# Patient Record
Sex: Male | Born: 1935 | Hispanic: No | Marital: Married
Health system: Southern US, Community
[De-identification: ages and names within clinical notes are randomized; demographics above are authoritative.]

---

## 2006-01-27 ENCOUNTER — Emergency Department: Payer: Self-pay | Admitting: Emergency Medicine

## 2006-02-01 ENCOUNTER — Emergency Department: Payer: Self-pay

## 2006-02-13 ENCOUNTER — Emergency Department: Payer: Self-pay | Admitting: Emergency Medicine

## 2006-03-05 ENCOUNTER — Emergency Department: Payer: Self-pay | Admitting: Urology

## 2006-03-28 ENCOUNTER — Ambulatory Visit: Payer: Self-pay | Admitting: Specialist

## 2006-03-28 ENCOUNTER — Other Ambulatory Visit: Payer: Self-pay

## 2006-04-04 ENCOUNTER — Ambulatory Visit: Payer: Self-pay | Admitting: Specialist

## 2007-09-24 ENCOUNTER — Other Ambulatory Visit: Payer: Self-pay

## 2007-09-24 ENCOUNTER — Ambulatory Visit: Payer: Self-pay | Admitting: Urology

## 2007-10-08 ENCOUNTER — Ambulatory Visit: Payer: Self-pay | Admitting: Urology

## 2008-05-18 ENCOUNTER — Ambulatory Visit: Payer: Self-pay | Admitting: Internal Medicine

## 2008-05-18 ENCOUNTER — Ambulatory Visit: Payer: Self-pay | Admitting: Ophthalmology

## 2008-05-31 ENCOUNTER — Ambulatory Visit: Payer: Self-pay | Admitting: Ophthalmology

## 2009-06-17 ENCOUNTER — Ambulatory Visit: Payer: Self-pay | Admitting: Rheumatology

## 2012-12-14 ENCOUNTER — Inpatient Hospital Stay: Payer: Self-pay | Admitting: Internal Medicine

## 2012-12-14 LAB — COMPREHENSIVE METABOLIC PANEL
Alkaline Phosphatase: 76 U/L (ref 50–136)
Anion Gap: 9 (ref 7–16)
Chloride: 105 mmol/L (ref 98–107)
Co2: 21 mmol/L (ref 21–32)
Creatinine: 1.15 mg/dL (ref 0.60–1.30)
EGFR (Non-African Amer.): 55 — ABNORMAL LOW
Glucose: 123 mg/dL — ABNORMAL HIGH (ref 65–99)
Osmolality: 274 (ref 275–301)
SGPT (ALT): 19 U/L (ref 12–78)
Sodium: 135 mmol/L — ABNORMAL LOW (ref 136–145)

## 2012-12-14 LAB — TROPONIN I
Troponin-I: 0.12 ng/mL — ABNORMAL HIGH
Troponin-I: 0.13 ng/mL — ABNORMAL HIGH
Troponin-I: 0.15 ng/mL — ABNORMAL HIGH
Troponin-I: 0.16 ng/mL — ABNORMAL HIGH

## 2012-12-14 LAB — CBC
HCT: 36 % — ABNORMAL LOW (ref 40.0–52.0)
MCH: 30.7 pg (ref 26.0–34.0)
MCHC: 32.9 g/dL (ref 32.0–36.0)
Platelet: 157 10*3/uL (ref 150–440)
RBC: 3.86 10*6/uL — ABNORMAL LOW (ref 4.40–5.90)
RDW: 14.5 % (ref 11.5–14.5)
WBC: 5.2 10*3/uL (ref 3.8–10.6)

## 2012-12-14 LAB — PRO B NATRIURETIC PEPTIDE: B-Type Natriuretic Peptide: 10239 pg/mL — ABNORMAL HIGH (ref 0–450)

## 2012-12-14 LAB — CK TOTAL AND CKMB (NOT AT ARMC)
CK, Total: 175 U/L (ref 35–232)
CK-MB: 2.8 ng/mL (ref 0.5–3.6)
CK-MB: 2.9 ng/mL (ref 0.5–3.6)
CK-MB: 2.9 ng/mL (ref 0.5–3.6)

## 2012-12-14 LAB — URINALYSIS, COMPLETE
Glucose,UR: NEGATIVE mg/dL (ref 0–75)
Hyaline Cast: 8
Ketone: NEGATIVE
Ph: 5 (ref 4.5–8.0)
RBC,UR: 2 /HPF (ref 0–5)
Renal Epithelial: <1
Squamous Epithelial: 1
WBC UR: 7 /HPF (ref 0–5)

## 2012-12-14 LAB — TSH: Thyroid Stimulating Horm: 1.44 u[IU]/mL

## 2012-12-15 LAB — CBC WITH DIFFERENTIAL/PLATELET
Basophil %: 1 %
Eosinophil %: 3.4 %
HCT: 33.2 % — ABNORMAL LOW (ref 40.0–52.0)
HGB: 11.2 g/dL — ABNORMAL LOW (ref 13.0–18.0)
Lymphocyte #: 1.4 10*3/uL (ref 1.0–3.6)
Lymphocyte %: 21.6 %
MCV: 93 fL (ref 80–100)
Monocyte %: 9.7 %
Neutrophil %: 64.3 %
Platelet: 185 10*3/uL (ref 150–440)
RBC: 3.56 10*6/uL — ABNORMAL LOW (ref 4.40–5.90)
RDW: 14.6 % — ABNORMAL HIGH (ref 11.5–14.5)
WBC: 6.5 10*3/uL (ref 3.8–10.6)

## 2012-12-15 LAB — BASIC METABOLIC PANEL
Anion Gap: 11 (ref 7–16)
BUN: 24 mg/dL — ABNORMAL HIGH (ref 7–18)
Calcium, Total: 8.8 mg/dL (ref 8.5–10.1)
Chloride: 102 mmol/L (ref 98–107)
Co2: 22 mmol/L (ref 21–32)
Creatinine: 1.61 mg/dL — ABNORMAL HIGH (ref 0.60–1.30)
EGFR (African American): 42 — ABNORMAL LOW
Glucose: 186 mg/dL — ABNORMAL HIGH (ref 65–99)
Osmolality: 279 (ref 275–301)
Potassium: 4.2 mmol/L (ref 3.5–5.1)

## 2012-12-15 LAB — COMPREHENSIVE METABOLIC PANEL
Albumin: 3.3 g/dL — ABNORMAL LOW (ref 3.4–5.0)
Bilirubin,Total: 0.4 mg/dL (ref 0.2–1.0)

## 2012-12-15 LAB — LIPID PANEL
HDL Cholesterol: 76 mg/dL — ABNORMAL HIGH (ref 40–60)
Ldl Cholesterol, Calc: 43 mg/dL (ref 0–100)
Triglycerides: 78 mg/dL (ref 0–200)
VLDL Cholesterol, Calc: 16 mg/dL (ref 5–40)

## 2012-12-16 LAB — COMPREHENSIVE METABOLIC PANEL
Calcium, Total: 8.6 mg/dL (ref 8.5–10.1)
Co2: 22 mmol/L (ref 21–32)
EGFR (African American): 27 — ABNORMAL LOW
EGFR (Non-African Amer.): 23 — ABNORMAL LOW
Osmolality: 269 (ref 275–301)
SGOT(AST): 65 U/L — ABNORMAL HIGH (ref 15–37)
Sodium: 127 mmol/L — ABNORMAL LOW (ref 136–145)
Total Protein: 6.7 g/dL (ref 6.4–8.2)

## 2012-12-16 LAB — BASIC METABOLIC PANEL
Calcium, Total: 8.5 mg/dL (ref 8.5–10.1)
Chloride: 100 mmol/L (ref 98–107)
Co2: 23 mmol/L (ref 21–32)
Creatinine: 2.22 mg/dL — ABNORMAL HIGH (ref 0.60–1.30)
Osmolality: 271 (ref 275–301)

## 2012-12-16 LAB — CBC WITH DIFFERENTIAL/PLATELET
Eosinophil #: 0 10*3/uL (ref 0.0–0.7)
HGB: 11.3 g/dL — ABNORMAL LOW (ref 13.0–18.0)
Lymphocyte #: 0.9 10*3/uL — ABNORMAL LOW (ref 1.0–3.6)
MCHC: 33.8 g/dL (ref 32.0–36.0)
MCV: 93 fL (ref 80–100)
Monocyte %: 8.1 %
Neutrophil %: 78 %
Platelet: 184 10*3/uL (ref 150–440)

## 2012-12-17 LAB — COMPREHENSIVE METABOLIC PANEL
Albumin: 3.2 g/dL — ABNORMAL LOW (ref 3.4–5.0)
Alkaline Phosphatase: 101 U/L (ref 50–136)
Anion Gap: 10 (ref 7–16)
Bilirubin,Total: 0.4 mg/dL (ref 0.2–1.0)
Calcium, Total: 8.2 mg/dL — ABNORMAL LOW (ref 8.5–10.1)
Co2: 20 mmol/L — ABNORMAL LOW (ref 21–32)
Creatinine: 2.69 mg/dL — ABNORMAL HIGH (ref 0.60–1.30)
EGFR (African American): 23 — ABNORMAL LOW
EGFR (Non-African Amer.): 20 — ABNORMAL LOW
Glucose: 131 mg/dL — ABNORMAL HIGH (ref 65–99)
Potassium: 4.9 mmol/L (ref 3.5–5.1)
SGOT(AST): 54 U/L — ABNORMAL HIGH (ref 15–37)
SGPT (ALT): 66 U/L (ref 12–78)
Sodium: 126 mmol/L — ABNORMAL LOW (ref 136–145)

## 2012-12-17 LAB — CBC WITH DIFFERENTIAL/PLATELET
Basophil #: 0 10*3/uL (ref 0.0–0.1)
Basophil %: 0.1 %
Lymphocyte #: 1.1 10*3/uL (ref 1.0–3.6)
Lymphocyte %: 14.5 %
MCH: 31.4 pg (ref 26.0–34.0)
MCV: 92 fL (ref 80–100)
Monocyte #: 0.6 x10 3/mm (ref 0.2–1.0)
Neutrophil %: 77.5 %
Platelet: 181 10*3/uL (ref 150–440)
RBC: 3.56 10*6/uL — ABNORMAL LOW (ref 4.40–5.90)
WBC: 7.5 10*3/uL (ref 3.8–10.6)

## 2012-12-17 LAB — TROPONIN I: Troponin-I: 0.17 ng/mL — ABNORMAL HIGH

## 2012-12-17 LAB — URINALYSIS, COMPLETE
Blood: NEGATIVE
Hyaline Cast: 79
Ketone: NEGATIVE
Specific Gravity: 1.012 (ref 1.003–1.030)
Squamous Epithelial: 1
Transitional Epi: <1

## 2012-12-18 LAB — CBC WITH DIFFERENTIAL/PLATELET
Basophil #: 0 10*3/uL (ref 0.0–0.1)
Basophil %: 0.4 %
Eosinophil #: 0 10*3/uL (ref 0.0–0.7)
HGB: 11.9 g/dL — ABNORMAL LOW (ref 13.0–18.0)
MCH: 31 pg (ref 26.0–34.0)
MCHC: 33.9 g/dL (ref 32.0–36.0)
MCV: 91 fL (ref 80–100)
Monocyte #: 1.1 x10 3/mm — ABNORMAL HIGH (ref 0.2–1.0)
Monocyte %: 9 %
Neutrophil %: 79 %
RBC: 3.83 10*6/uL — ABNORMAL LOW (ref 4.40–5.90)
WBC: 12 10*3/uL — ABNORMAL HIGH (ref 3.8–10.6)

## 2012-12-18 LAB — BASIC METABOLIC PANEL
Anion Gap: 12 (ref 7–16)
BUN: 56 mg/dL — ABNORMAL HIGH (ref 7–18)
Chloride: 95 mmol/L — ABNORMAL LOW (ref 98–107)
EGFR (African American): 22 — ABNORMAL LOW
EGFR (Non-African Amer.): 19 — ABNORMAL LOW
Potassium: 5.1 mmol/L (ref 3.5–5.1)
Sodium: 125 mmol/L — ABNORMAL LOW (ref 136–145)

## 2012-12-19 LAB — COMPREHENSIVE METABOLIC PANEL
Albumin: 3.1 g/dL — ABNORMAL LOW (ref 3.4–5.0)
BUN: 61 mg/dL — ABNORMAL HIGH (ref 7–18)
Calcium, Total: 8.4 mg/dL — ABNORMAL LOW (ref 8.5–10.1)
Co2: 22 mmol/L (ref 21–32)
EGFR (African American): 23 — ABNORMAL LOW
Glucose: 96 mg/dL (ref 65–99)
Osmolality: 269 (ref 275–301)
Potassium: 5 mmol/L (ref 3.5–5.1)
Sodium: 125 mmol/L — ABNORMAL LOW (ref 136–145)
Total Protein: 6.3 g/dL — ABNORMAL LOW (ref 6.4–8.2)

## 2012-12-19 LAB — CBC WITH DIFFERENTIAL/PLATELET
Basophil #: 0 10*3/uL (ref 0.0–0.1)
HCT: 32.5 % — ABNORMAL LOW (ref 40.0–52.0)
Lymphocyte %: 7.4 %
MCH: 31 pg (ref 26.0–34.0)
MCHC: 33.4 g/dL (ref 32.0–36.0)
MCV: 93 fL (ref 80–100)
Neutrophil #: 10 10*3/uL — ABNORMAL HIGH (ref 1.4–6.5)
Neutrophil %: 82.2 %
RBC: 3.51 10*6/uL — ABNORMAL LOW (ref 4.40–5.90)
RDW: 14.6 % — ABNORMAL HIGH (ref 11.5–14.5)
WBC: 12.2 10*3/uL — ABNORMAL HIGH (ref 3.8–10.6)

## 2013-01-24 DEATH — deceased

## 2014-07-16 NOTE — Consult Note (Signed)
PATIENT NAME:  Daniel Galloway, Daniel Galloway MR#:  [id number] DATE OF BIRTH:  01/12/1919  DATE OF CONSULTATION:  12/14/2012  REFERRING PHYSICIAN:  Dr. Hyacinth MeekerMiller.  CONSULTING PHYSICIAN:  Marcina MillardAlexander Maida Widger, MD  CHIEF COMPLAINT: Shortness of breath.   HISTORY OF PRESENT ILLNESS: The patient is a 81 year old gentleman referred for evaluation of atrial fibrillation and atrial flutter. The patient was in his usual state of health until he presented to [facility] Emergency Room with 7 to 10 day history of increasing shortness of breath and peripheral edema. The patient was recently started on outpatient antibiotics but the patient has continued to experience nonproductive cough. In the Emergency Room, the patient was noted to be in atrial fibrillation and atrial flutter with a rate of 110 to 120. Chest x-ray revealed evidence for pulmonary edema. The patient was treated with intravenous furosemide. Initial troponin was borderline elevated at 0.12. The patient denies chest pain.   PAST MEDICAL HISTORY: 1. Hypertension.  2. Benign prostatic hypertrophy.   HOME MEDICATIONS:  1. Doxazosin 4 mg daily.  2. Chlorthalidone 25 mg daily.  3. Loratadine 10 mg daily.  4. Fish oil 1000 mg daily.  5. Colchicine 0.6 mg daily.   SOCIAL HISTORY: The patient is a widower. He currently lives with his daughter. He denies tobacco abuse. The patient is not very active. He is a limited by pain in his right hip.   FAMILY HISTORY: No immediate family history of coronary artery disease or myocardial infarction.   REVIEW OF SYSTEMS:  CONSTITUTIONAL: The patient denies fever or chills.   EYES: No blurry vision.  EARS: No hearing loss.  RESPIRATORY: The patient has shortness of breath, nonproductive cough.  CARDIOVASCULAR: The patient currently denies chest pain.  GASTROINTESTINAL: No nausea, vomiting, diarrhea, or constipation.  GENITOURINARY: No dysuria or hematuria.  ENDOCRINE: No polyuria or polydipsia.  HEMATOLOGICAL: No easy bruising  or bleeding.  INTEGUMENTARY: No rash.  MUSCULOSKELETAL: The patient has generalized arthritis and right hip pain.  NEUROLOGICAL: No focal muscle weakness or numbness.  PSYCHOLOGICAL: No depression or anxiety.   PHYSICAL EXAMINATION: VITAL SIGNS: Blood pressure 152/70, pulse 103, respirations 18, temperature 97.3, pulse oximetry 96%.  HEENT: Pupils equal, reactive to light and accommodation.  NECK: Supple without thyromegaly. LUNGS: Reveal decreased breath sounds in both bases.  HEART: Normal JVP. Normal PMI. Regular rate and rhythm. Normal S1, S2. No appreciable gallop, murmur, or rub.  ABDOMEN: Soft and nontender. Pulses were intact bilaterally. MUSCULOSKELETAL: norma lmuscle tone.  NEUROLOGIC: The patient is alert and oriented x3. Motor and sensory both grossly intact.   IMPRESSION: This is a 81 year old gentleman with probable baseline chronic obstructive pulmonary disease who presents with shortness of breath, pulmonary edema, probable bronchitis with atrial fibrillation/flutter now converted to sinus rhythm with borderline elevated troponin likely due to demand supply ischemia and not due to acute coronary syndrome.   RECOMMENDATIONS: 1. Agree with overall current therapy.  2. Would defer full dose anticoagulation.  3. In light of the patient's advanced age and risk for falling, would continue aspirin for stroke prevention and defer chronic anticoagulation.  4. Agree with metoprolol, change dosing to 50 mg b.i.d.  5. Review 2-D echocardiogram.  6. Would defer functional study or invasive cardiac evaluation in light of the patient's advanced age.  ____________________________ Marcina MillardAlexander Jacklyne Baik, MD ap:sg D: 12/14/2012 09:47:53 ET T: 12/14/2012 10:47:05 ET JOB#: [id number]  cc: Marcina MillardAlexander Jahlani Lorentz, MD, <Dictator> Marcina MillardALEXANDER Marzella Miracle MD ELECTRONICALLY SIGNED 01/05/2013 11:25

## 2014-07-16 NOTE — H&P (Signed)
PATIENT NAME:  Daniel Galloway, Daniel Galloway MR#:  [id number] DATE OF BIRTH:  1918-08-17  DATE OF ADMISSION:  12/14/2012  PRIMARY CARE PHYSICIAN: Dr. Bethann Punches.   REFERRING PHYSICIAN: Dr. Dolores Frame.   CHIEF COMPLAINT: Shortness of breath and ankle swelling.   HISTORY OF PRESENT ILLNESS: The patient is a 81 year old African American male with a past medical history of hypertension and benign prostatic hypertrophy. He is presenting to the ER with chief complaint of severe shortness of breath. The patient's daughter at bedside is reporting that the patient has been short of breath since 09/12. His ankles are swollen and as his shortness of breath is progressively getting worse he was seen by his primary care physician on 09/17. The patient was not started on any antibiotics at that time. The patient is reporting that his ankles are swelling and his abdomen is swelling as well. Last night he could not breathe and having palpitations in his chest. He thought he is going to die and called his daughter, who brought him into the ER via EMS. In the ER, the patient was found to be in new onset atrial fibrillation with a heart rate between 110 to 120. Also, the patient was wheezing and chest x-ray has revealed pulmonary edema. The patient's BNP is elevated and he is diagnosed with new onset congestive heart failure as well. The patient has received Lasix IV, and hospitalist team is called to admit the patient. First set of troponin is elevated at 0.12. The patient also has received aspirin, sublingual nitroglycerin while he was in the ER. During my examination, the patient denies any chest pain, reporting that shortness of breath is slightly better, but still feeling some degree of shortness of breath. Denies any palpitations. Daughter is at bedside. He never had any history of heart attacks or congestive heart failure in the past and never seen by any cardiologist in the past.   PAST MEDICAL HISTORY: Hypertension, benign prostatic  hypertrophy.   PAST SURGICAL HISTORY: Herniorrhaphy.   ALLERGIES:   LEVAQUIN AND VIOXX.   PSYCHOSOCIAL HISTORY: Lives at home. Daughter lives with him. He used to smoke but quit smoking at age 20. Denies alcohol or illicit drug usage.   FAMILY HISTORY: Hypertension runs in his family.    REVIEW OF SYSTEMS:  CONSTITUTIONAL: Denies any fever or fatigue.  EYES: Denies any blurry vision or pain.  ENT: Denies any epistaxis or discharge.  RESPIRATORY: Complaining of dry cough, wheezing, and shortness of breath.  CARDIOVASCULAR: Chest discomfort with palpitations.  GASTROINTESTINAL: Denies nausea, vomiting, diarrhea, abdominal pain, but he is complaining of swelling in his abdomen.  GENITOURINARY: No dysuria, hematuria.  ENDOCRINE: Denies polyuria, nocturia and, thyroid problems.  HEMATOLOGIC AND LYMPHATIC: Denies anemia, easy bruising, bleeding.  INTEGUMENTARY: No acne, rash, lesions.  MUSCULOSKELETAL: No joint effusion, tenderness, erythema except for ankle edema.  NEUROLOGICAL: No vertigo or ataxia.  PSYCHIATRIC: Denies any ADD, OCD.   PHYSICAL EXAMINATION: VITAL SIGNS: Temperature 98.2, pulse 107, blood pressure 151/89, pulse of 98% on 2 liters.  GENERAL APPEARANCE: Not in any acute distress, moderately built and nourished. He is hard of hearing.  HEENT: Normocephalic, atraumatic. Pupils are equal, reacting to light and accommodation. No scleral icterus. No conjunctival injection. No sinus tenderness. No postnasal drip. Moist mucous membranes.  NECK: Supple. No JVD. No thyromegaly. Range of motion is intact.  LUNGS: Positive rales and rhonchi. Minimal end expiratory wheezing is present diffusely.  CARDIOVASCULAR: Irregularly irregular.  GASTROINTESTINAL: Soft. Bowel sounds are positive in all four  quadrants. Nontender, nondistended. No hepatosplenomegaly. No masses felt.  NEUROLOGIC: Awake, alert, oriented x3. Motor and sensory are grossly intact. Reflexes are 2+.  EXTREMITIES:  Positive 1+ pitting edema. No cyanosis. No clubbing.  SKIN: Warm to touch. Normal turgor. No rashes. No lesions.  MUSCULOSKELETAL: Positive ankle edema. Other than that no joint effusion, tenderness or erythema.   PSYCHIATRIC: Normal mood and affect. The patient is hard of hearing.   LABORATORY AND IMAGING STUDIES: A 12-lead EKG has revealed atrial fibrillation with heart rate at around 114, right bundle branch block, left axis deviation. Chest x-ray has revealed pulmonary edema. LFTs are normal. CK total 175, CPK-MB 2.8, troponin 0.12. TSH 1.44. WBC 5.2, hemoglobin 11.8, hematocrit 36.0, platelets 157, glucose 123, BNP elevated at 10,239, BUN 19, creatinine 1.15, sodium 135, potassium 4.1, chloride 105, CO2 21. GFR greater than 60. Anion gap is 9, serum osmolality 274, calcium 9.2.   ASSESSMENT AND PLAN: A 81 year old African American male brought into the ER for shortness of breath, chest discomfort and swelling in his feet will be admitted with the following assessment and plan:  1. Shortness of breath, probably from new onset atrial fibrillation and new onset congestive heart failure. Admit him to telemetry bed.   The patient will be on Lasix IV, aspirin, statin and beta blocker.   Not considering Lovenox full dose as the patient is elderly at age 81. Cardiology consult is placed to Dr. Darrold JunkerParaschos. I will get 2-D echocardiogram.  Detectable troponin is probably from demand ischemia from new onset congestive heart failure and atrial fibrillation, but will rule out acute myocardial infarction . We will cardiac biomarkers.  We will check fasting lipid panel, and the patient will be on statin.   2. Hypertension. The patient is initiated on metoprolol and will resume his home medication.  3. Benign prostatic hypertrophy. Resume home medications.  4. We will provide gastrointestinal and deep vein thrombosis prophylaxis.  5. He is full code. Son and daughter are the medical power of attorney. Plan of  care discussed in detail with the patient and his daughter at bedside. They verbalized understanding of the plan. Transfer the patient to Dr. Bethann PunchesMark Miller in  a.m.   TOTAL TIME SPENT ON ADMISSION: 45 minutes.     ____________________________ Ramonita LabAruna Emeka Lindner, MD ag:sg D: 12/14/2012 06:36:26 ET T: 12/14/2012 07:21:38 ET JOB#: [id number]  cc: Ramonita LabAruna Jahlil Ziller, MD, <Dictator> Danella PentonMark F. Miller, MD  Ramonita LabARUNA Shevonne Wolf MD ELECTRONICALLY SIGNED 12/16/2012 23:53

## 2014-07-16 NOTE — Discharge Summary (Signed)
PATIENT NAME:  Daniel Galloway, Jarrette MR#:  [id number] DATE OF BIRTH:  1919-01-19  DATE OF ADMISSION:  12/14/2012 DATE OF DISCHARGE:  12/19/2012   DISCHARGE DIAGNOSES:  1. 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  2. Acute renal failure.  3. Respiratory failure secondary to pneumonia.  4. Atrial fibrillation, rate uncontrolled, resolved.  5. Gout.  6. Transaminitis due to cardiomyopathy.   DISCHARGE MEDICATIONS:  1. Hydrocodone 5/325 mg 1 b.i.d. p.r.n. 2. Albuterol 1 puff q.i.d.  3. Cefuroxime 250 mg b.i.d.  4. Robitussin-DM 5 mL q.i.d.  5. Metoprolol succinate 25 mg daily.   REASON FOR ADMISSION: A 81 year old male presents with respiratory failure and renal dysfunction. Please see H and P for HPI, past medical history and physical exam.   HOSPITAL COURSE: The patient was admitted, found to have an EF of 20%, with rising creatinine. He was thought to have pneumonia as well. He was started on IV antibiotics. He had significant O2 requirement. IV diuresis was attempted; however, his creatinine just went up further. Ultimately, he did not respond to fluids or Lasix in regard to his renal function, and all of that was stopped. His renal function now has stabilized at 2.7. His infection was treated with IV antibiotics, then later p.o. Ceftin. He has remained on 2 liters O2. His LFTs are up mildly thought due to poor flow phenomenon. He was not a candidate for heart catheterization or dialysis, per family. He will be going home with hospice care.   OVERALL PROGNOSIS: Terminal.   ____________________________ Danella PentonMark F. Miller, MD mfm:OSi D: 12/19/2012 07:25:50 ET T: 12/19/2012 07:43:52 ET JOB#: [id number]  cc: Danella PentonMark F. Miller, MD, <Dictator> MARK Sherlene ShamsF MILLER MD ELECTRONICALLY SIGNED 12/19/2012 8:08

## 2015-03-01 IMAGING — CR DG CHEST 1V PORT
1 series · 1 of 1 positions shown · non-contrast
Comparison: none

REASON FOR EXAM: PALPITATIONS
COMMENTS:

PROCEDURE:     DXR - DXR PORTABLE CHEST SINGLE VIEW  - December 14, 2012  [DATE]
RESULT:     Comparison: None

[ap]
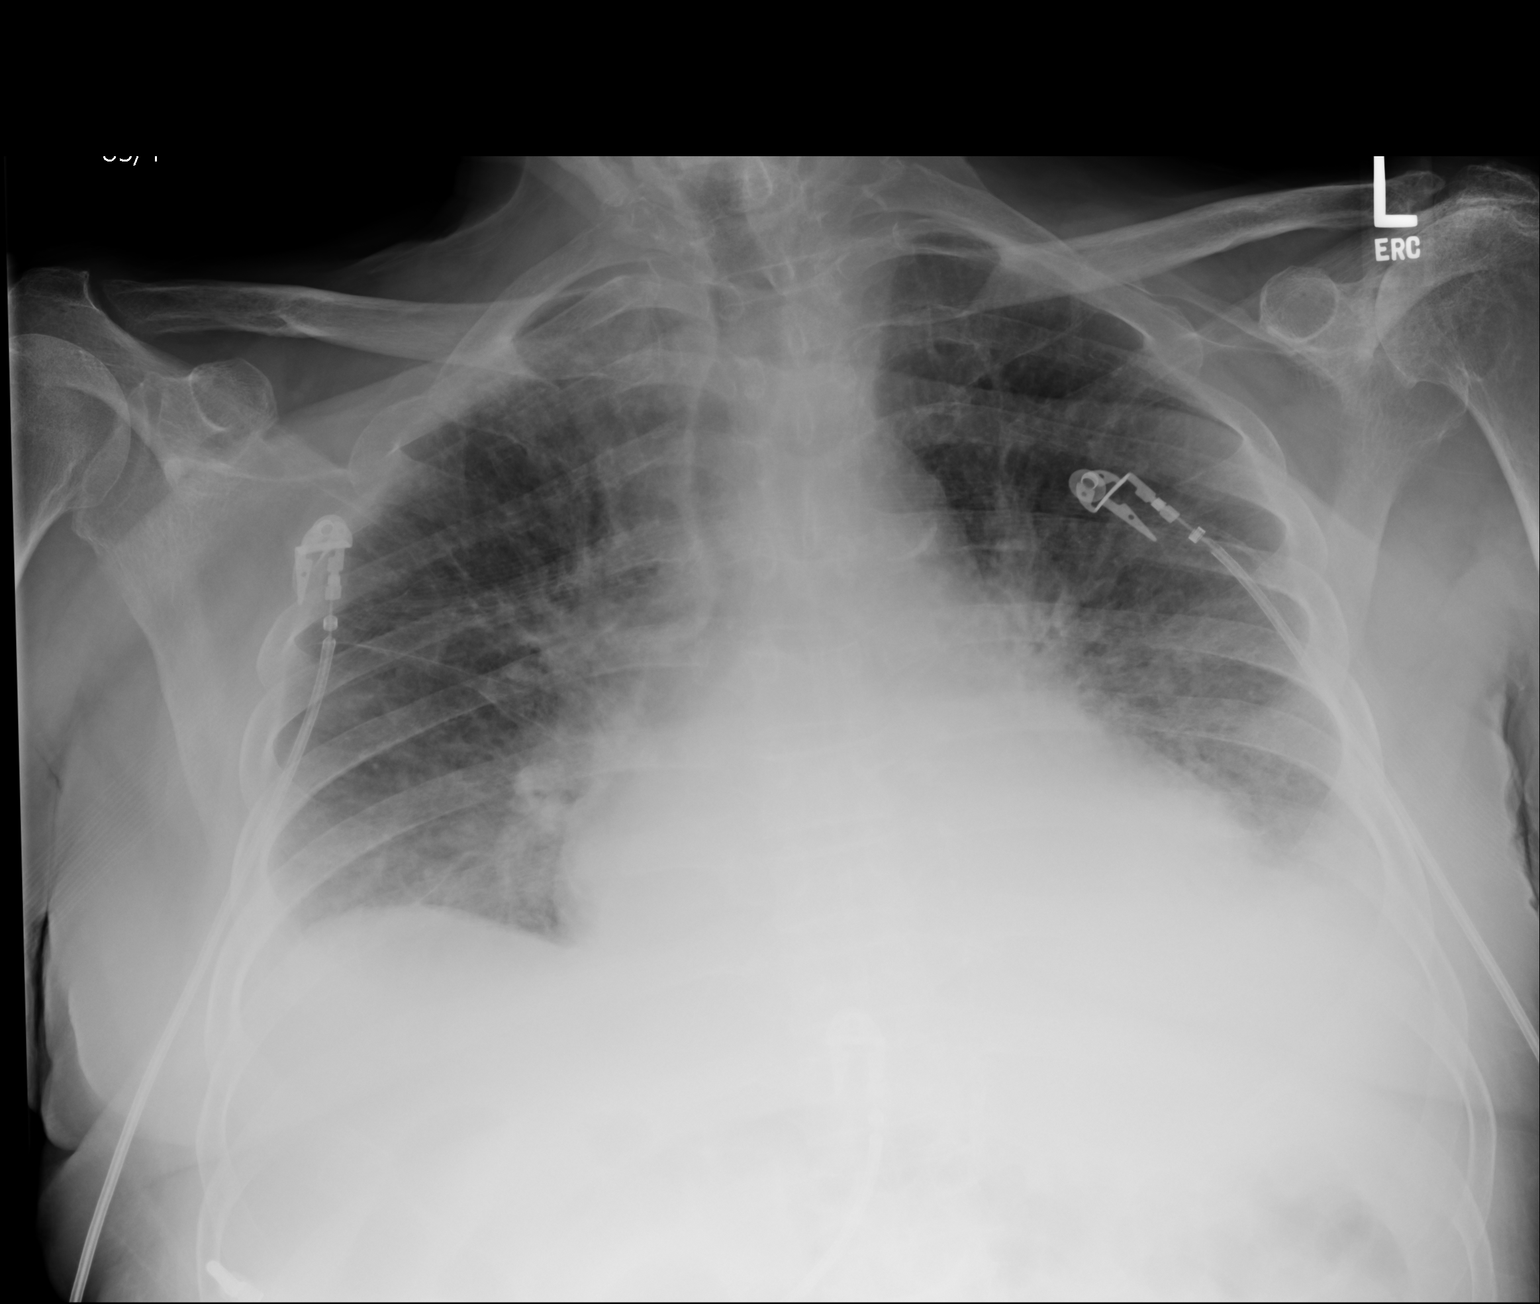

[1 of 1 positions shown; findings below may reference images not displayed]

FINDINGS: Single portable AP chest radiograph is provided. There is a small left
pleural effusion. There is bilateral diffuse interstitial thickening likely
representing interstitial edema versus interstitial pneumonitis secondary to
an infectious or inflammatory etiology. There is no pneumothorax. Normal
cardiomediastinal silhouette. The osseous structures are unremarkable.
IMPRESSION: There is bilateral diffuse interstitial thickening likely representing
interstitial edema versus interstitial pneumonitis secondary to an
infectious or inflammatory etiology.

[REDACTED]

## 2015-03-04 IMAGING — CR DG CHEST 2V
1 series · 4 of 4 positions shown · non-contrast
Comparison: none

REASON FOR EXAM: SOB, abnormal exam on right
COMMENTS:

[Series 4: x chest ap · 0.14mm/px · 4 of 4 slices shown]
[im 1/4]
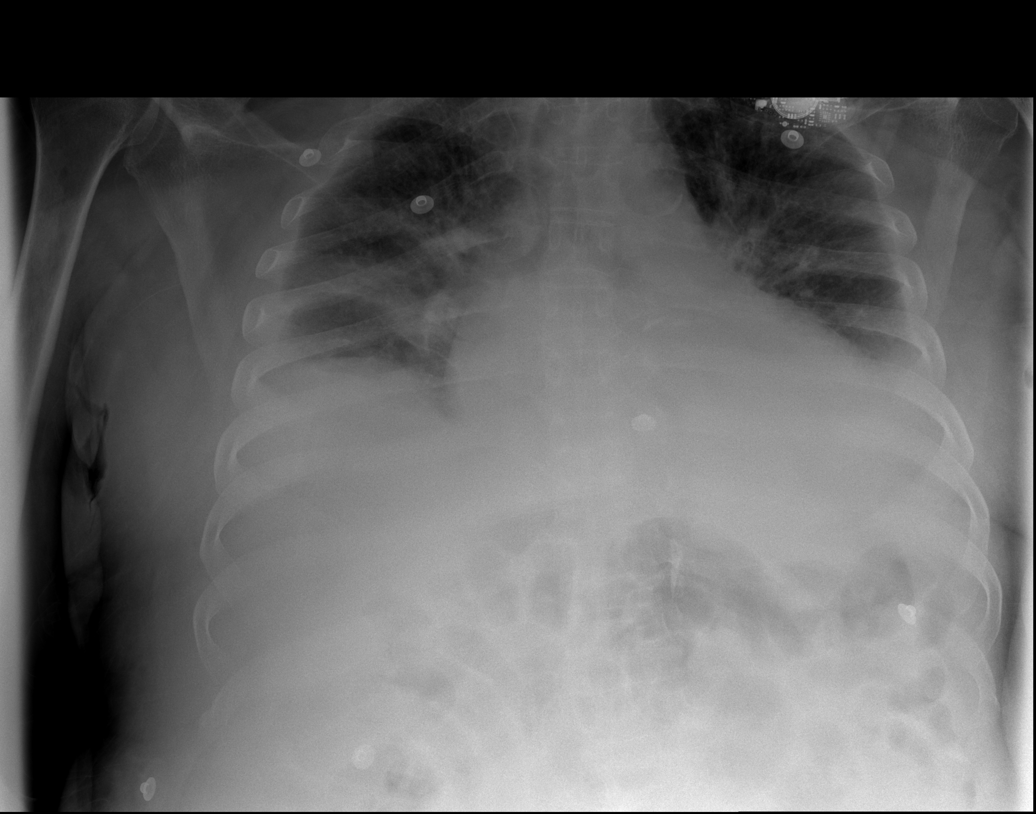
[im 2/4]
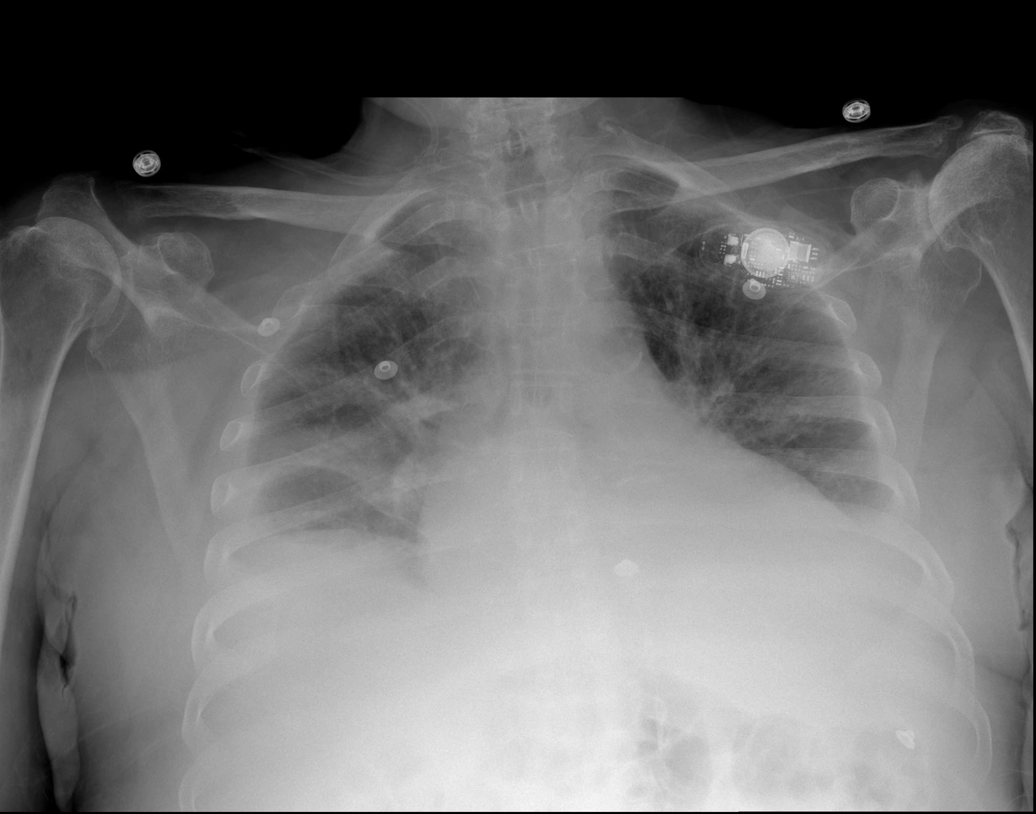
[im 3/4]
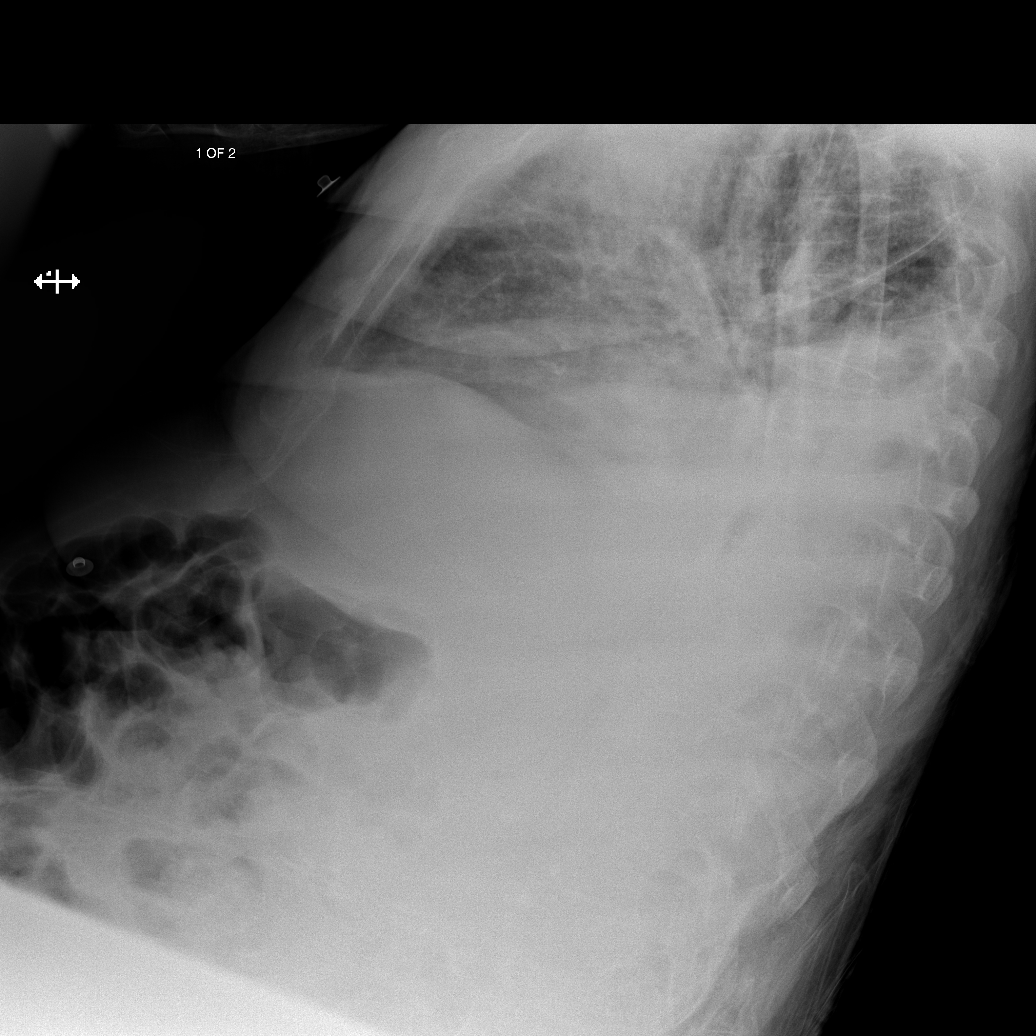
[im 4/4]
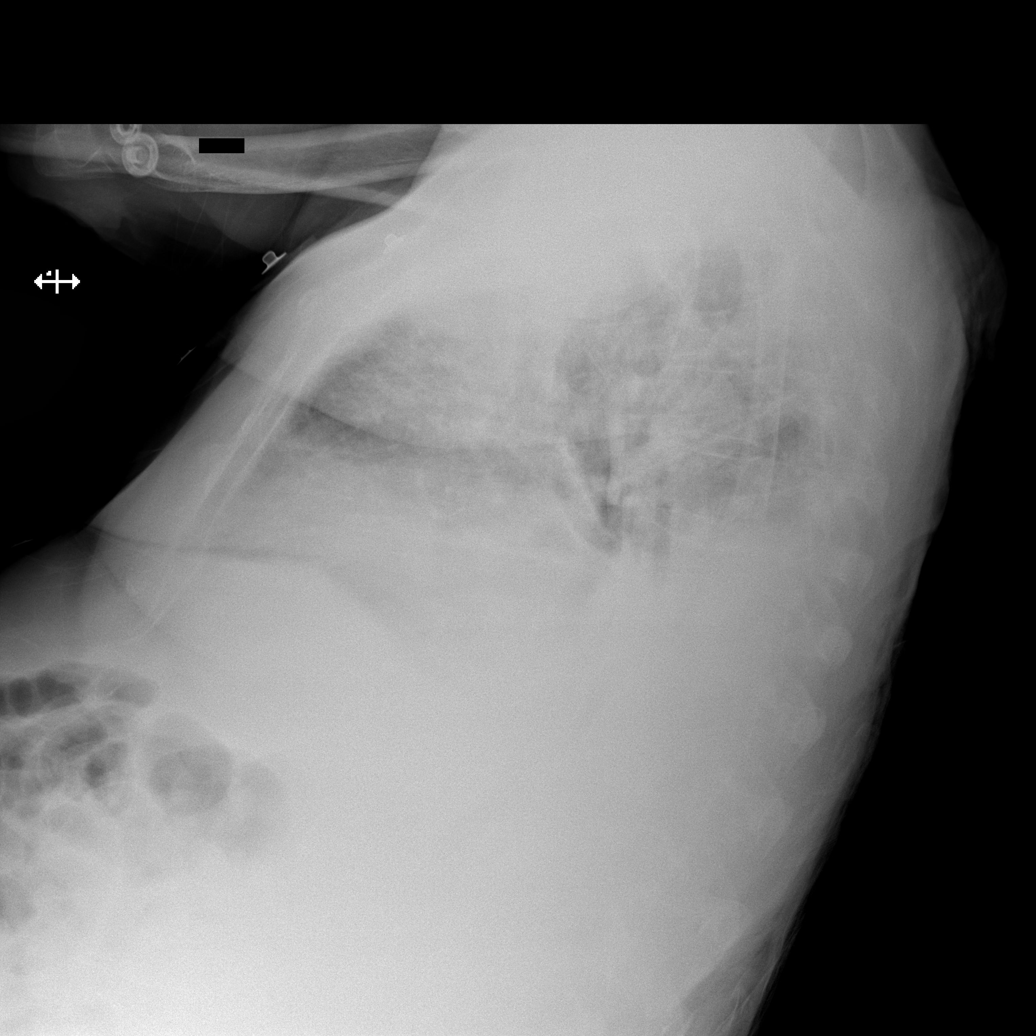

[4 of 4 positions shown; findings below may reference images not displayed]

PROCEDURE:     DXR - DXR CHEST PA (OR AP) AND LATERAL  - December 17, 2012 [DATE]

RESULT:     Comparison is made to the study of 12/15/2012. There are
moderately large bilateral pleural effusions with shallow inspiration and
right lung atelectasis versus infiltrate. The lung markings are coarse which
could be because of poor inspiration. No pneumothorax is seen.
IMPRESSION: Poor inspiratory effort. Bilateral effusions with right
lung atelectasis versus infiltrate. The appearance is worsened compared to
the previous study. Heart is mildly enlarged. There is evidence of mild
edema.

[REDACTED]
